# Patient Record
Sex: Male | Born: 2005 | Race: White | Hispanic: Yes | Marital: Single | Smoking: Never smoker
Health system: Southern US, Community
[De-identification: ages and names within clinical notes are randomized; demographics above are authoritative.]

## PROBLEM LIST (undated history)

## (undated) DIAGNOSIS — R040 Epistaxis: Secondary | ICD-10-CM

## (undated) DIAGNOSIS — R519 Headache, unspecified: Secondary | ICD-10-CM

## (undated) HISTORY — DX: Epistaxis: R04.0

## (undated) HISTORY — PX: OTHER SURGICAL HISTORY: SHX169

---
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
## 2023-06-23 IMAGING — US US EXTREM LOW VENOUS*L*
1 series · 14 of 24 positions shown · non-contrast
Comparison: None.

CLINICAL DATA: Left lower extremity pain and swelling.

EXAM:
Left LOWER EXTREMITY VENOUS DOPPLER ULTRASOUND
TECHNIQUE: Gray-scale sonography with compression, as well as color and duplex
ultrasound, were performed to evaluate the deep venous system(s)
from the level of the common femoral vein through the popliteal and
proximal calf veins.

[Series 1: us extrem low venous*left* · 14 of 56 slices shown]
[im 1/56]
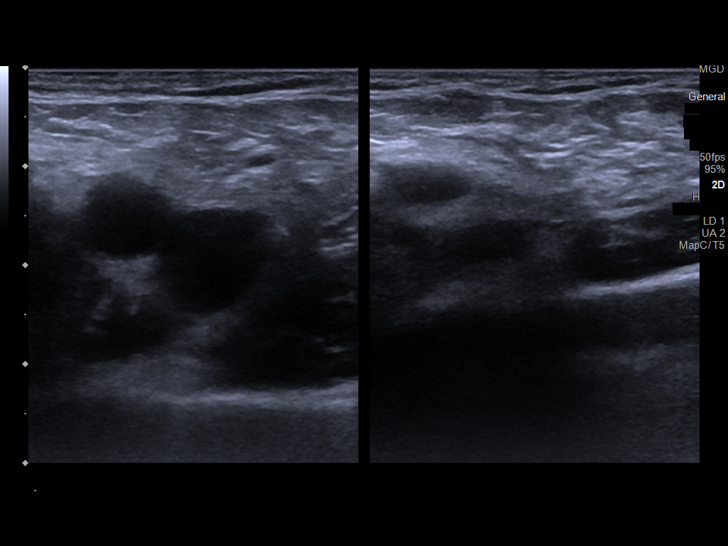
[im 5/56]
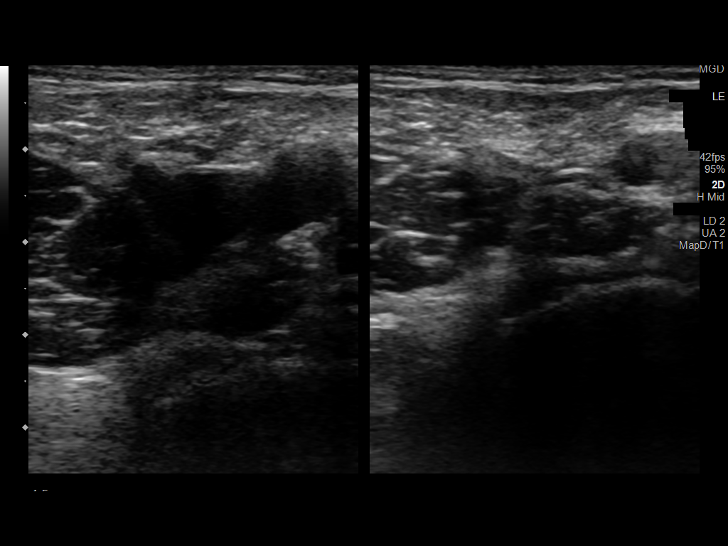
[im 10/56]
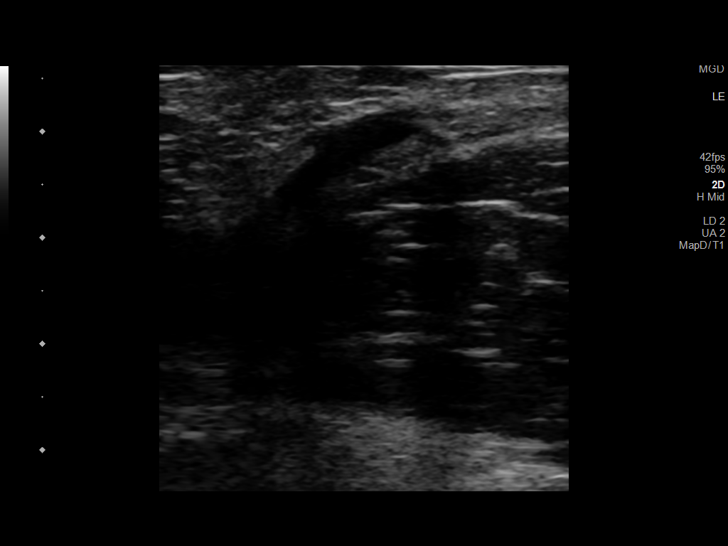
[im 15/56]
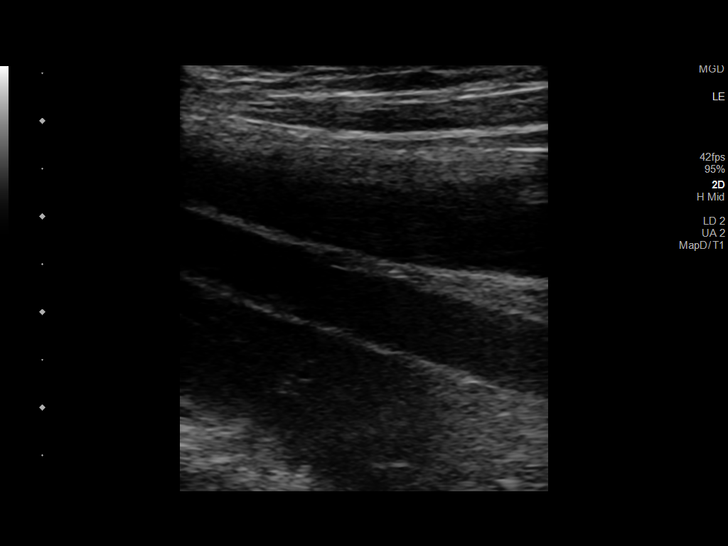
[im 17/56]
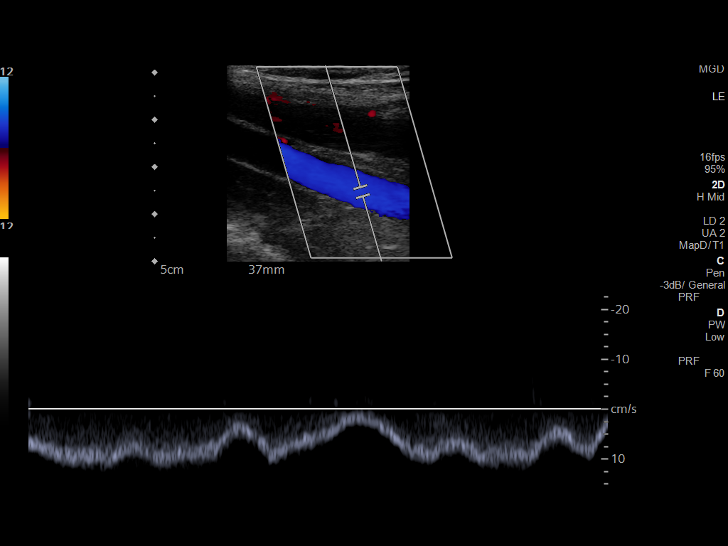
[im 22/56]
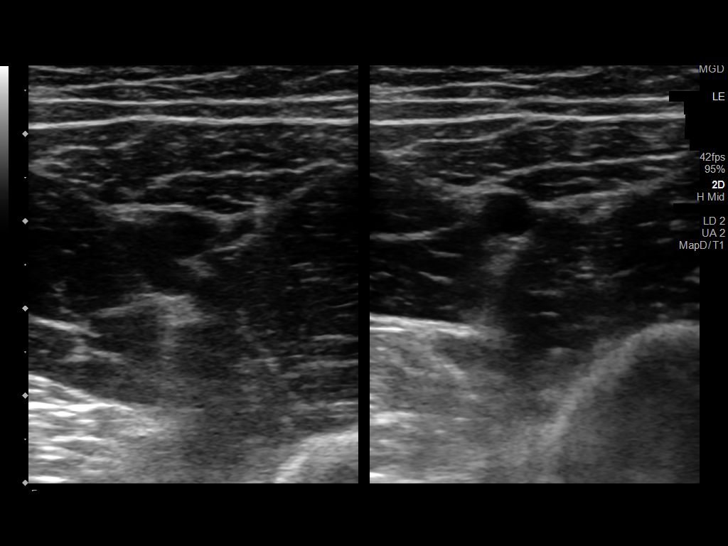
[im 27/56]
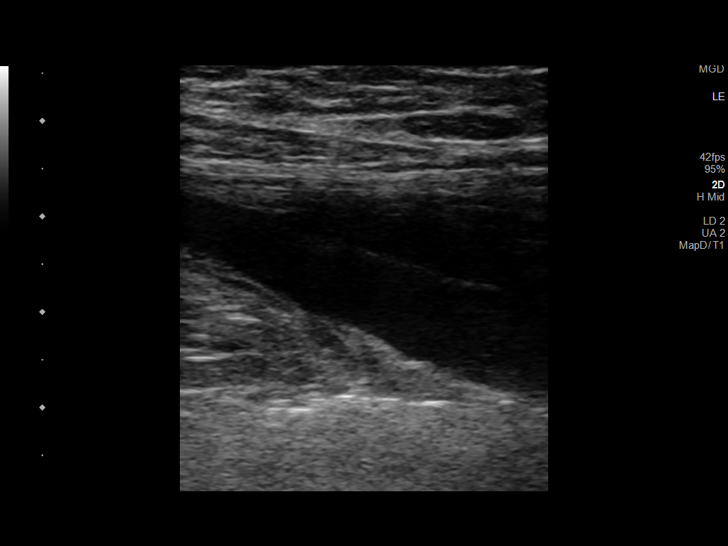
[im 29/56]
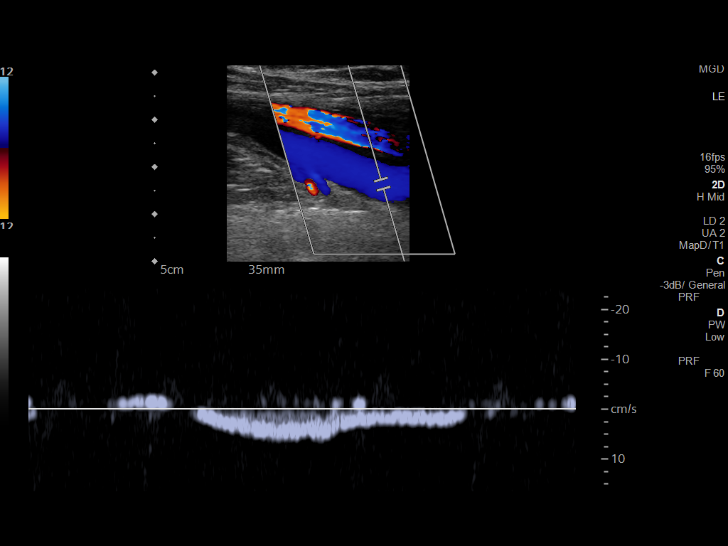
[im 34/56]
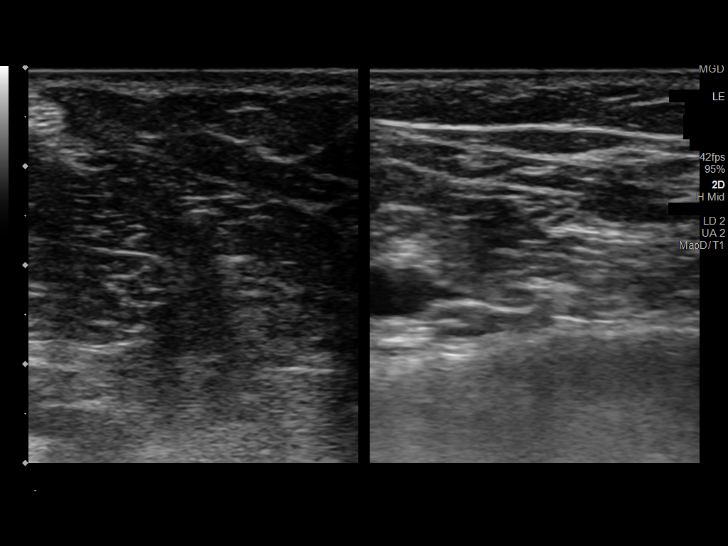
[im 39/56]
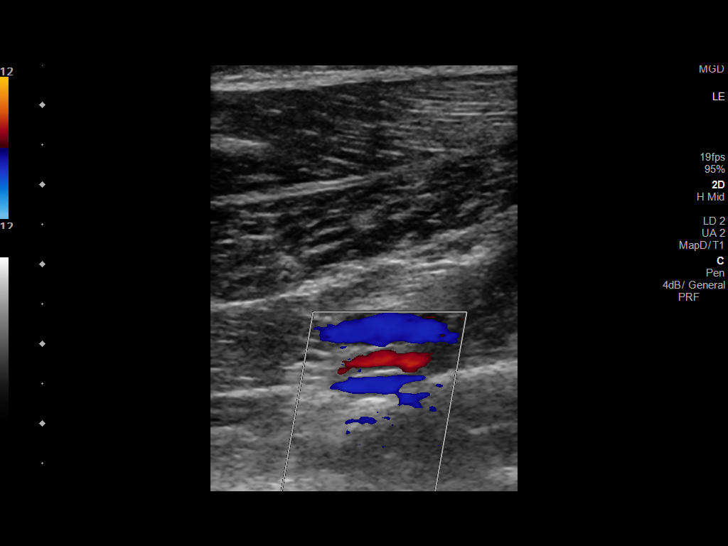
[im 44/56]
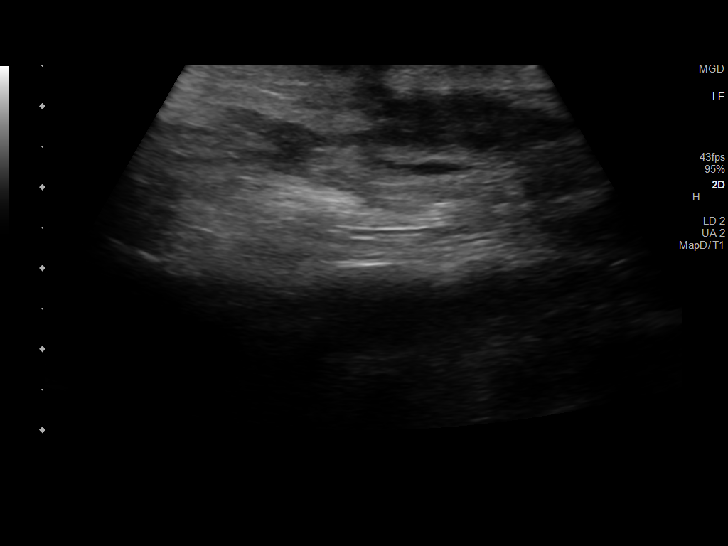
[im 46/56]
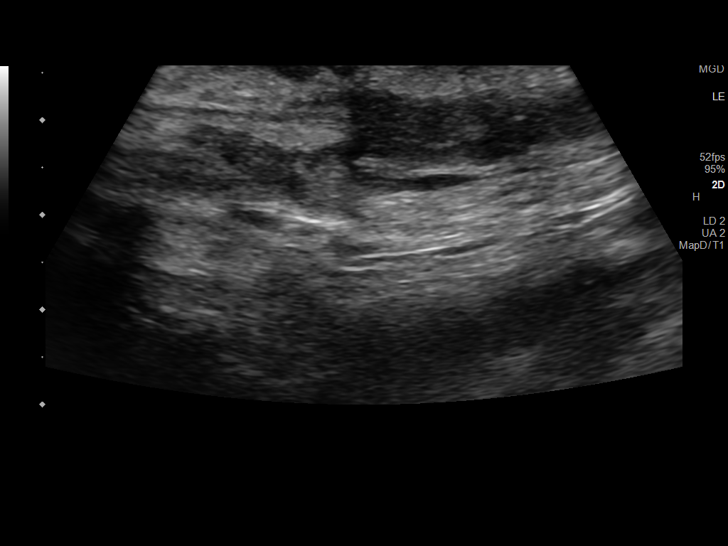
[im 51/56]
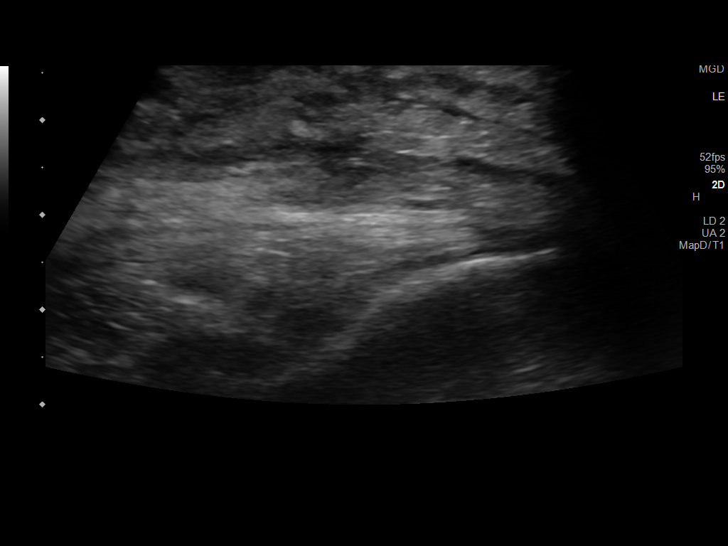
[im 56/56]
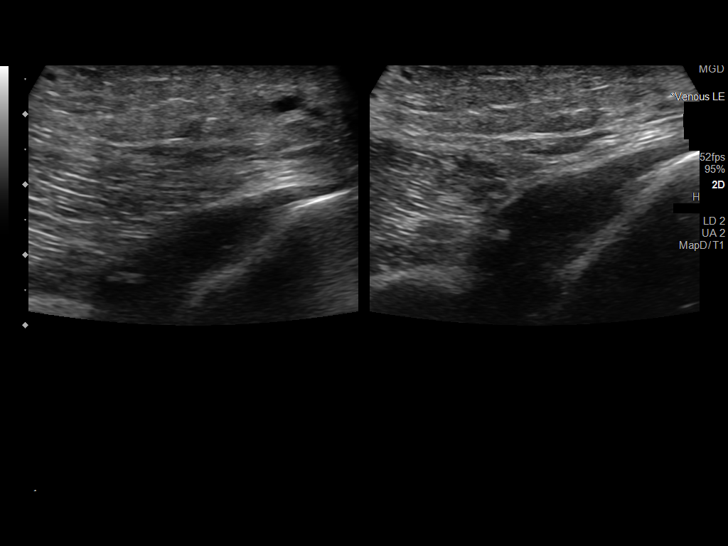

[14 of 24 positions shown; findings below may reference images not displayed]

FINDINGS: VENOUS

Normal compressibility of the common femoral, superficial femoral,
and popliteal veins, as well as the visualized calf veins.
Visualized portions of profunda femoral vein and great saphenous
vein unremarkable. No filling defects to suggest DVT on grayscale or
color Doppler imaging. Doppler waveforms show normal direction of
venous flow, normal respiratory plasticity and response to
augmentation.

Limited views of the contralateral common femoral vein are
unremarkable.

OTHER

There is subcutaneous edema in the area of trauma. No fluid
collection.

Limitations: none
IMPRESSION: 1. No sonographic evidence of DVT in the left lower extremity.
2. Subcutaneous edema corresponding to the area of trauma. No fluid
collection or hematoma.
